# Patient Record
Sex: Female | Born: 1955 | Race: White | Hispanic: No | Marital: Married | Smoking: Never smoker
Health system: Southern US, Community
[De-identification: ages and names within clinical notes are randomized; demographics above are authoritative.]

## PROBLEM LIST (undated history)

## (undated) DIAGNOSIS — E079 Disorder of thyroid, unspecified: Secondary | ICD-10-CM

## (undated) HISTORY — PX: OTHER SURGICAL HISTORY: SHX169

---

## 1984-07-18 HISTORY — PX: HYSTEROSCOPY WITH D & C: SHX1775

## 1999-12-02 ENCOUNTER — Emergency Department (HOSPITAL_COMMUNITY): Admission: EM | Admit: 1999-12-02 | Discharge: 1999-12-02 | Payer: Self-pay | Admitting: Emergency Medicine

## 2000-12-19 ENCOUNTER — Encounter: Admission: RE | Admit: 2000-12-19 | Discharge: 2000-12-19 | Payer: Self-pay | Admitting: Obstetrics and Gynecology

## 2000-12-19 ENCOUNTER — Encounter: Payer: Self-pay | Admitting: Obstetrics and Gynecology

## 2001-12-27 ENCOUNTER — Encounter: Admission: RE | Admit: 2001-12-27 | Discharge: 2001-12-27 | Payer: Self-pay | Admitting: Obstetrics and Gynecology

## 2001-12-27 ENCOUNTER — Encounter: Payer: Self-pay | Admitting: Obstetrics and Gynecology

## 2004-01-05 ENCOUNTER — Encounter: Admission: RE | Admit: 2004-01-05 | Discharge: 2004-01-05 | Payer: Self-pay | Admitting: Obstetrics and Gynecology

## 2011-04-09 ENCOUNTER — Encounter: Payer: Self-pay | Admitting: Emergency Medicine

## 2011-04-09 ENCOUNTER — Inpatient Hospital Stay (INDEPENDENT_AMBULATORY_CARE_PROVIDER_SITE_OTHER)
Admission: RE | Admit: 2011-04-09 | Discharge: 2011-04-09 | Disposition: A | Discharge status: Home / Self Care | Payer: BC Managed Care – PPO | Source: Ambulatory Visit | Attending: Emergency Medicine | Admitting: Emergency Medicine

## 2011-04-09 DIAGNOSIS — N390 Urinary tract infection, site not specified: Secondary | ICD-10-CM

## 2011-04-09 DIAGNOSIS — E039 Hypothyroidism, unspecified: Secondary | ICD-10-CM | POA: Insufficient documentation

## 2011-04-09 LAB — CONVERTED CEMR LAB
Bilirubin Urine: NEGATIVE
Glucose, Urine, Semiquant: NEGATIVE
Ketones, urine, test strip: NEGATIVE
Nitrite: NEGATIVE
Protein, U semiquant: NEGATIVE
Specific Gravity, Urine: <1.005
Urobilinogen, UA: 0.2
pH: 6.5

## 2011-04-11 ENCOUNTER — Telehealth (INDEPENDENT_AMBULATORY_CARE_PROVIDER_SITE_OTHER): Payer: Self-pay | Admitting: *Deleted

## 2011-06-20 NOTE — Progress Notes (Signed)
Summary: uti/TM(rm5)   Vital Signs:  Patient Profile:   55 Years Old Female CC:      ? UTI Height:     66 inches Weight:      167.25 pounds O2 Sat:      98 % O2 treatment:    Room Air Temp:     98.1 degrees F oral Resp:     18 per minute BP sitting:   135 / 85  (left arm) Cuff size:   regular  Vitals Entered By: Linton Flemings RN (April 09, 2011 10:55 AM)                  Updated Prior Medication List: LEVOTHROID 75 MCG TABS (LEVOTHYROXINE SODIUM) daily  Current Allergies: ! PENICILLINHistory of Present Illness History from: patient Chief Complaint: ? UTI History of Present Illness: 55 Years Old Female complains of UTI symptoms for 2 days.  She describes the pain as burning during urination.  She has not used any OTC meds. + dysuria + frequency No urgency No hematuria No vaginal discharge No fever/chills +/- lower abdomenal pain No back pain No fatigue   REVIEW OF SYSTEMS Constitutional Symptoms      Denies fever, chills, night sweats, weight loss, weight gain, and fatigue.  Eyes       Denies change in vision, eye pain, eye discharge, glasses, contact lenses, and eye surgery. Ear/Nose/Throat/Mouth       Denies hearing loss/aids, change in hearing, ear pain, ear discharge, dizziness, frequent runny nose, frequent nose bleeds, sinus problems, sore throat, hoarseness, and tooth pain or bleeding.  Respiratory       Denies dry cough, productive cough, wheezing, shortness of breath, asthma, bronchitis, and emphysema/COPD.  Cardiovascular       Denies murmurs, chest pain, and tires easily with exhertion.    Gastrointestinal       Denies stomach pain, nausea/vomiting, diarrhea, constipation, blood in bowel movements, and indigestion. Genitourniary       Complains of painful urination.      Denies kidney stones and loss of urinary control.      Comments: frequency Neurological       Denies paralysis, seizures, and fainting/blackouts. Musculoskeletal  Denies muscle pain, joint pain, joint stiffness, decreased range of motion, redness, swelling, muscle weakness, and gout.  Skin       Denies bruising, unusual mles/lumps or sores, and hair/skin or nail changes.  Psych       Denies mood changes, temper/anger issues, anxiety/stress, speech problems, depression, and sleep problems. Other Comments: started thursday   Past History:  Past Medical History: Hypothyroidism  Past Surgical History: Carpal tunnel right hand- 12/2009  Family History: Reviewed history and no changes required.  Social History: smoke-no alcohol-no rec. drugs-no Physical Exam General appearance: well developed, well nourished, no acute distress Abdomen: soft, non-tender without obvious organomegaly Back: no CVA or flank tenderness MSE: oriented to time, place, and person Assessment New Problems: HYPOTHYROIDISM (ICD-244.9) URINARY TRACT INFECTION (ICD-599.0)   Patient Education: Patient and/or caregiver instructed in the following: rest, fluids.  Plan New Medications/Changes: CIPROFLOXACIN HCL 500 MG TABS (CIPROFLOXACIN HCL) 1 by mouth two times a day for 5 days  #10 x 0, 04/09/2011, Hoyt Koch MD  New Orders: New Patient Level III [id number] UA Dipstick w/o Micro (automated)  [81003] T-Culture, Urine [[id number]] Planning Comments:   Urine culture pending Follow-up with your primary care physician if not improving or if getting worse   The patient and/or caregiver has been  counseled thoroughly with regard to medications prescribed including dosage, schedule, interactions, rationale for use, and possible side effects and they verbalize understanding.  Diagnoses and expected course of recovery discussed and will return if not improved as expected or if the condition worsens. Patient and/or caregiver verbalized understanding.  Prescriptions: CIPROFLOXACIN HCL 500 MG TABS (CIPROFLOXACIN HCL) 1 by mouth two times a day for 5 days  #10 x 0   Entered  and Authorized by:   Hoyt Koch MD   Signed by:   Hoyt Koch MD on 04/09/2011   Method used:   Print then Give to Patient   RxID:   [id number]   Orders Added: 1)  New Patient Level III [32440] 2)  UA Dipstick w/o Micro (automated)  [81003] 3)  T-Culture, Urine [[id number]]    Laboratory Results   Urine Tests  Date/Time Received: April 09, 2011 11:14 AM  Date/Time Reported: April 09, 2011 11:14 AM   Routine Urinalysis   Color: yellow Appearance: Clear Glucose: negative   (Normal Range: Negative) Bilirubin: negative   (Normal Range: Negative) Ketone: negative   (Normal Range: Negative) Spec. Gravity: <1.005   (Normal Range: 1.003-1.035) Blood: trace-lysed   (Normal Range: Negative) pH: 6.5   (Normal Range: 5.0-8.0) Protein: negative   (Normal Range: Negative) Urobilinogen: 0.2   (Normal Range: 0-1) Nitrite: negative   (Normal Range: Negative) Leukocyte Esterace: 2+   (Normal Range: Negative)

## 2011-06-20 NOTE — Telephone Encounter (Signed)
  Phone Note Outgoing Call Call back at [facility] Phone [phone]   Call placed by: Lajean Saver RN,  April 11, 2011 3:59 PM Call placed to: Patient Summary of Call: Callback: No answer. Unable to leave message.

## 2011-07-29 ENCOUNTER — Emergency Department
Admission: EM | Admit: 2011-07-29 | Discharge: 2011-07-29 | Disposition: A | Discharge status: Home / Self Care | Payer: BC Managed Care – PPO | Source: Home / Self Care | Attending: Emergency Medicine | Admitting: Emergency Medicine

## 2011-07-29 ENCOUNTER — Encounter: Payer: Self-pay | Admitting: Emergency Medicine

## 2011-07-29 DIAGNOSIS — L03114 Cellulitis of left upper limb: Secondary | ICD-10-CM

## 2011-07-29 DIAGNOSIS — L02519 Cutaneous abscess of unspecified hand: Secondary | ICD-10-CM

## 2011-07-29 HISTORY — DX: Disorder of thyroid, unspecified: E07.9

## 2011-07-29 MED ORDER — DOXYCYCLINE HYCLATE 100 MG PO CAPS: 100.0000 mg | ORAL_CAPSULE | Freq: Two times a day (BID) | ORAL | Status: AC

## 2011-07-29 NOTE — ED Provider Notes (Addendum)
History     CSN: [id number]  Arrival date & time 07/29/11  1346   None     Chief Complaint  Patient presents with  . Insect Bite    (Consider location/radiation/quality/duration/timing/severity/associated sxs/prior treatment) HPI She is a right-handed female who presents with a left hand possible spider bite. It happened about 6 days when she was petting her dog that has long hair. She does not know if she was bit by something but states that it could have been a spider. Since then she's had very mild swelling and bruising of her left hand. She states that it does not seem to be getting any worse, however her family did suggest that she come to the doctor to have it checked out. No fever, chills, nausea, vomiting.  Past Medical History  Diagnosis Date  . Thyroid disease     Past Surgical History  Procedure Date  . Carpel tunnel     No family history on file.  History  Substance Use Topics  . Smoking status: Never Smoker   . Smokeless tobacco: Not on file  . Alcohol Use: No    OB History    Grav Para Term Preterm Abortions TAB SAB Ect Mult Living                  Review of Systems  Allergies  Penicillins  Home Medications   Current Outpatient Rx  Name Route Sig Dispense Refill  . LEVOTHYROXINE SODIUM 75 MCG PO TABS Oral Take 75 mcg by mouth daily.    Marland Kitchen DOXYCYCLINE HYCLATE 100 MG PO CAPS Oral Take 1 capsule (100 mg total) by mouth 2 (two) times daily. 18 capsule 0    BP 130/81  Pulse 63  Temp(Src) 98.4 F (36.9 C) (Oral)  Resp 18  Ht 5\' 6"  (1.676 m)  Wt 165 lb (74.844 kg)  BMI 26.63 kg/m2  SpO2 99%  Physical Exam  Nursing note and vitals reviewed. Constitutional: She is oriented to person, place, and time. She appears well-developed and well-nourished.  HENT:  Head: Normocephalic and atraumatic.  Eyes: No scleral icterus.  Neck: Neck supple.  Cardiovascular: Regular rhythm and normal heart sounds.   Pulmonary/Chest: Effort normal and breath  sounds normal. No respiratory distress.  Neurological: She is alert and oriented to person, place, and time.  Skin: Skin is warm and dry.       On her left hand around the second finger MCP and the dorsal webspace there is a area approximately 3 cm in diameter that has erythema and is mildly swollen and mildly tender. There is an area that appears to be a very small bite mark from an insect. She has full range of motion the distal neurovascular status is intact.  Psychiatric: She has a normal mood and affect. Her speech is normal.    ED Course  Procedures (including critical care time)  Labs Reviewed - No data to display No results found.   1. Cellulitis of left hand       MDM   The lesion on her left hand may be a very slight staph infection in am going to give her prescription for doxycycline. However it also may be some bruising from a bite or just some inflammation. Due to the location I do not believe that a tic would be on her long enough to transmit anything worse and the patient agrees. She should continue to elevate, ice. If things are getting worse, I have advised the patient  that she needs to make sure that she seeks medical care.  Lily Kocher, MD 07/29/11 1429  Lily Kocher, MD 07/29/11 [phone]

## 2011-07-29 NOTE — ED Notes (Signed)
6 days ago was petting her dog and felt insect bite her; did not see tick or any other critter; was itchy x 24 hours; now slightly edematous and ecchymotic.

## 2011-08-16 ENCOUNTER — Emergency Department
Admission: EM | Admit: 2011-08-16 | Discharge: 2011-08-16 | Disposition: A | Discharge status: Home / Self Care | Payer: BC Managed Care – PPO | Source: Home / Self Care | Attending: Family Medicine | Admitting: Family Medicine

## 2011-08-16 ENCOUNTER — Encounter: Payer: Self-pay | Admitting: *Deleted

## 2011-08-16 DIAGNOSIS — N760 Acute vaginitis: Secondary | ICD-10-CM

## 2011-08-16 DIAGNOSIS — R300 Dysuria: Secondary | ICD-10-CM

## 2011-08-16 DIAGNOSIS — J029 Acute pharyngitis, unspecified: Secondary | ICD-10-CM

## 2011-08-16 LAB — POCT RAPID STREP A (OFFICE): Rapid Strep A Screen: NEGATIVE

## 2011-08-16 LAB — POCT URINALYSIS DIP (MANUAL ENTRY)
Bilirubin, UA: NEGATIVE
Blood, UA: NEGATIVE
Glucose, UA: NEGATIVE
Ketones, POC UA: NEGATIVE
Leukocytes, UA: NEGATIVE
Nitrite, UA: NEGATIVE
Protein Ur, POC: NEGATIVE
Spec Grav, UA: 1.01 (ref 1.005–1.03)
Urobilinogen, UA: 0.2 (ref 0–1)
pH, UA: 7 (ref 5–8)

## 2011-08-16 MED ORDER — FLUCONAZOLE 150 MG PO TABS: ORAL_TABLET | ORAL | Status: AC

## 2011-08-16 NOTE — ED Provider Notes (Signed)
History     CSN: [id number]  Arrival date & time 08/16/11  1107   First MD Initiated Contact with Patient 08/16/11 1223      Chief Complaint  Patient presents with  . Sore Throat  . Dysuria     HPI Comments: Patient states that she had dental work yesterday, and subsequently noticed a sore throat, mostly on left.  She will be taking care of young children next weekend, and wants to ensure she does not have a strep infection.  No other URI symptoms and she feels well otherwise. She also notes that she has had a slightly pruritic vaginal discharge past several days without pelvic or abdominal pain, which she states feels like a yeast infection.  She finished up a course of doxycycline one week ago.  She has noticed mild urinary frequency but no dysuria.  Patient is a 56 y.o. female presenting with pharyngitis and dysuria. The history is provided by the patient.  Sore Throat This is a new problem. The current episode started yesterday. The problem occurs constantly. The problem has not changed since onset.The symptoms are aggravated by swallowing. The symptoms are relieved by nothing.  Dysuria     Past Medical History  Diagnosis Date  . Thyroid disease     Past Surgical History  Procedure Date  . Carpel tunnel     History reviewed. No pertinent family history.  History  Substance Use Topics  . Smoking status: Never Smoker   . Smokeless tobacco: Not on file  . Alcohol Use: No    OB History    Grav Para Term Preterm Abortions TAB SAB Ect Mult Living                  Review of Systems  Genitourinary: Positive for dysuria.   + sore throat No cough No pleuritic pain No wheezing + mild nasal congestion ? post-nasal drainage No sinus pain/pressure No itchy/red eyes No earache No hemoptysis No SOB No fever/chills No nausea No vomiting No abdominal pain No diarrhea No urinary symptoms + mild vaginal discharge No skin rashes No fatigue No myalgias No  headache Used OTC meds without relief  Allergies  Penicillins  Home Medications   Current Outpatient Rx  Name Route Sig Dispense Refill  . FLUCONAZOLE 150 MG PO TABS  Take one by mouth as a single dose 1 tablet 0  . LEVOTHYROXINE SODIUM 75 MCG PO TABS Oral Take 75 mcg by mouth daily.      BP 132/82  Pulse 80  Temp(Src) 98.4 F (36.9 C) (Oral)  Resp 14  Ht 5\' 6"  (1.676 m)  Wt 168 lb (76.204 kg)  BMI 27.12 kg/m2  SpO2 100%  Physical Exam Nursing notes and Vital Signs reviewed. Appearance:  Patient appears healthy, stated age, and in no acute distress Eyes:  Pupils are equal, round, and reactive to light and accomodation.  Extraocular movement is intact.  Conjunctivae are not inflamed  Ears:   Right canal normal; left canal partly occluded with cerumen.  Right tympanic membrane normal.  Nose:  Mildly congested turbinates.  No sinus tenderness.   Pharynx:  Normal Neck:  Supple. No adenopathy  Lungs:  Clear to auscultation.  Breath sounds are equal.  Heart:  Regular rate and rhythm without murmurs, rubs, or gallops.  Abdomen:  Nontender without masses or hepatosplenomegaly.  Bowel sounds are present.  No CVA or flank tenderness.     ED Course  Procedures none   Labs Reviewed  POCT URINALYSIS DIP (MANUAL ENTRY) negative  POCT RAPID STREP A (OFFICE) negative         1. Acute pharyngitis   2. Vaginitis       MDM  With recent history of antibiotic treatment, vaginal discharge is suggestive of candida.  Will empirically treat with Diflucan. There is no evidence of bacterial infection today.  Treat symptomatically for now: If cold symptoms develop, recommend: Take Mucinex D (guaifenesin with decongestant) twice daily for congestion.  Increase fluid intake, rest. May use Afrin nasal spray (or generic oxymetazoline) twice daily for about 5 days.  Also recommend using saline nasal spray several times daily and saline nasal irrigation (AYR is a common brand) Stop all  antihistamines for now, and other non-prescription cough/cold preparations. May take Delsym Cough Suppressant at bedtime for nighttime cough.  Follow-up with family doctor if not improving 7 to 10 days.         Donna Christen, MD 08/16/11 2147

## 2011-08-16 NOTE — ED Notes (Signed)
Patient c/o vaginal itching x 2 days. She finished an antibiotic 1 week ago. She has not seen any yeast. She also c/o of a sore throat x last night. She had dental work done for 2 1/2 hours yesterday, leaving her mouth open the whole time.

## 2021-07-23 ENCOUNTER — Encounter: Payer: Self-pay | Admitting: Emergency Medicine

## 2021-07-23 ENCOUNTER — Emergency Department
Admission: EM | Admit: 2021-07-23 | Discharge: 2021-07-23 | Disposition: A | Discharge status: Home / Self Care | Payer: Medicare PPO | Source: Home / Self Care | Attending: Family Medicine | Admitting: Family Medicine

## 2021-07-23 ENCOUNTER — Emergency Department (INDEPENDENT_AMBULATORY_CARE_PROVIDER_SITE_OTHER): Payer: Medicare PPO

## 2021-07-23 DIAGNOSIS — M79661 Pain in right lower leg: Secondary | ICD-10-CM | POA: Diagnosis not present

## 2021-07-23 DIAGNOSIS — M79604 Pain in right leg: Secondary | ICD-10-CM | POA: Diagnosis not present

## 2021-07-23 NOTE — ED Triage Notes (Signed)
Pain below R knee after hitting her leg on her husband's w/c 4 weeks ago  Pt had a bruise which resloved - continues to have pain  Tylenol  OTC - none recently Pain only when pressure is applied  No pain with walking

## 2021-07-23 NOTE — ED Provider Notes (Signed)
[facility]    CSN: [id number] Arrival date & time: 07/23/21  0807      History   Chief Complaint Chief Complaint  Patient presents with   Leg Pain    right    HPI Lori Murillo is a 66 y.o. female.   Patient complains of pain in her right anterior knee below the patella for about 4 weeks.  She reports that her husband had a stroke and she must help him transfer to and from his wheelchair.  She admits that she is constantly bumping her right anterior knee on the frame of his wheelchair.  The history is provided by the patient.  Leg Pain Location:  Knee Time since incident:  4 weeks Knee location:  R knee Pain details:    Quality:  Aching   Radiates to:  Does not radiate   Severity:  Mild   Onset quality:  Gradual   Duration:  4 weeks   Timing:  Constant   Progression:  Unchanged Relieved by:  Nothing Worsened by:  Extension Ineffective treatments:  Acetaminophen Associated symptoms: swelling   Associated symptoms: no decreased ROM, no fever, no muscle weakness, no numbness and no stiffness    Past Medical History:  Diagnosis Date   Thyroid disease     Patient Active Problem List   Diagnosis Date Noted   HYPOTHYROIDISM 04/09/2011    Past Surgical History:  Procedure Laterality Date   carpel tunnel     HYSTEROSCOPY WITH D & C  1986    OB History   No obstetric history on file.      Home Medications    Prior to Admission medications   Medication Sig Start Date End Date Taking? Authorizing Provider  methenamine (HIPREX) 1 g tablet Take 1 g by mouth 2 (two) times daily with a meal.   Yes [provider]  vitamin C (ASCORBIC ACID) 500 MG tablet Take 500 mg by mouth 2 (two) times daily.   Yes [provider]  fluconazole (DIFLUCAN) 150 MG tablet Take one by mouth as a single dose Patient not taking: Reported on 07/23/2021 08/16/11   Kandra Nicolas, MD  levothyroxine (SYNTHROID, LEVOTHROID) 75 MCG tablet Take 75 mcg by  mouth daily.    [provider]    Family History Family History  Problem Relation Age of Onset   Heart failure Mother    Colon cancer Father     Social History Social History   Tobacco Use   Smoking status: Never  Vaping Use   Vaping Use: Never used  Substance Use Topics   Alcohol use: No   Drug use: No     Allergies   Penicillins   Review of Systems Review of Systems  Constitutional:  Negative for chills, diaphoresis and fever.  Respiratory:  Negative for cough, chest tightness, shortness of breath, wheezing and stridor.   Cardiovascular:  Positive for leg swelling. Negative for chest pain.  Musculoskeletal:  Negative for joint swelling and stiffness.  Skin:  Negative for color change and wound.  All other systems reviewed and are negative.   Physical Exam Triage Vital Signs ED Triage Vitals  Enc Vitals Group     BP 07/23/21 0833 121/78     Pulse Rate 07/23/21 0833 86     Resp 07/23/21 0833 16     Temp 07/23/21 0833 98.5 F (36.9 C)     Temp Source 07/23/21 0833 Oral     SpO2 07/23/21 0833  100 %     Weight 07/23/21 0837 148 lb (67.1 kg)     Height 07/23/21 0837 5\' 6"  (1.676 m)     Head Circumference --      Peak Flow --      Pain Score 07/23/21 0835 2     Pain Loc --      Pain Edu? --      Excl. in [place]? --    No data found.  Updated Vital Signs BP 121/78 (BP Location: Left Arm)    Pulse 86    Temp 98.5 F (36.9 C) (Oral)    Resp 16    Ht 5\' 6"  (1.676 m)    Wt 67.1 kg    SpO2 100%    BMI 23.89 kg/m   Visual Acuity Right Eye Distance:   Left Eye Distance:   Bilateral Distance:    Right Eye Near:   Left Eye Near:    Bilateral Near:     Physical Exam Vitals and nursing note reviewed.  Constitutional:      General: She is not in acute distress. HENT:     Head: Normocephalic.  Eyes:     Conjunctiva/sclera: Conjunctivae normal.     Pupils: Pupils are equal, round, and reactive to light.  Cardiovascular:     Rate and Rhythm: Normal  rate.  Pulmonary:     Effort: Pulmonary effort is normal.  Musculoskeletal:     Right lower leg: No edema.     Left lower leg: No edema.       Legs:     Comments: Patient's right anterior lower leg has tenderness to palpation just beneath right tibial tubercle.  There is no swelling or ecchymosis.  The tenderness extends slightly inferior to the anterior compartment.  No posterior calf tenderness.  Right knee has full range of motion without tenderness to palpation.  Skin:    General: Skin is warm and dry.     Findings: No erythema.  Neurological:     Mental Status: She is alert and oriented to person, place, and time.     UC Treatments / Results  Labs (all labs ordered are listed, but only abnormal results are displayed) Labs Reviewed - No data to display  EKG   Radiology DG Tibia/Fibula Right  Result Date: 07/23/2021 CLINICAL DATA:  Proximal anterior right tibial pain. EXAM: RIGHT TIBIA AND FIBULA - 2 VIEW COMPARISON:  None. FINDINGS: Normal bone mineralization. The knee and ankle joint spaces are preserved. No acute fracture is seen. No knee joint effusion. No dislocation. IMPRESSION: No acute fracture. Electronically Signed   By: Yvonne Kendall   On: 07/23/2021 10:02    Procedures Procedures (including critical care time)  Medications Ordered in UC Medications - No data to display  Initial Impression / Assessment and Plan / UC Course  I have reviewed the triage vital signs and the nursing notes.  Pertinent labs & imaging results that were available during my care of the patient were reviewed by me and considered in my medical decision making (see chart for details).    Will treat as a shin splint.  Ace wrap applied. Given treatment instructions with range of motion and stretching exercises.  Recommend wearing a protective shield when moving husband to protect her right right knee and lateral leg.  Final Clinical Impressions(s) / UC Diagnoses   Final diagnoses:   Right leg pain     Discharge Instructions      Recommend applying light  Ace wrap to decrease swelling.  Apply ice pack for 20 to 30 minutes, 3 to 4 times daily  Continue until pain and swelling decrease.  May take Ibuprofen 200mg , 4 tabs every 8 hours with food as needed.  Begin range of motion and stretching exercises as tolerated.     ED Prescriptions   None       Kandra Nicolas, MD 07/24/21 1709

## 2021-07-23 NOTE — Discharge Instructions (Signed)
Recommend applying light Ace wrap to decrease swelling.  Apply ice pack for 20 to 30 minutes, 3 to 4 times daily  Continue until pain and swelling decrease.  May take Ibuprofen 200mg , 4 tabs every 8 hours with food as needed.  Begin range of motion and stretching exercises as tolerated.

## 2022-03-20 IMAGING — DX DG TIBIA/FIBULA 2V*R*
3 series · 3 of 3 positions shown · non-contrast
Comparison: None.

CLINICAL DATA: Proximal anterior right tibial pain.

EXAM:
RIGHT TIBIA AND FIBULA - 2 VIEW

[tibia ap (1 of 2)]
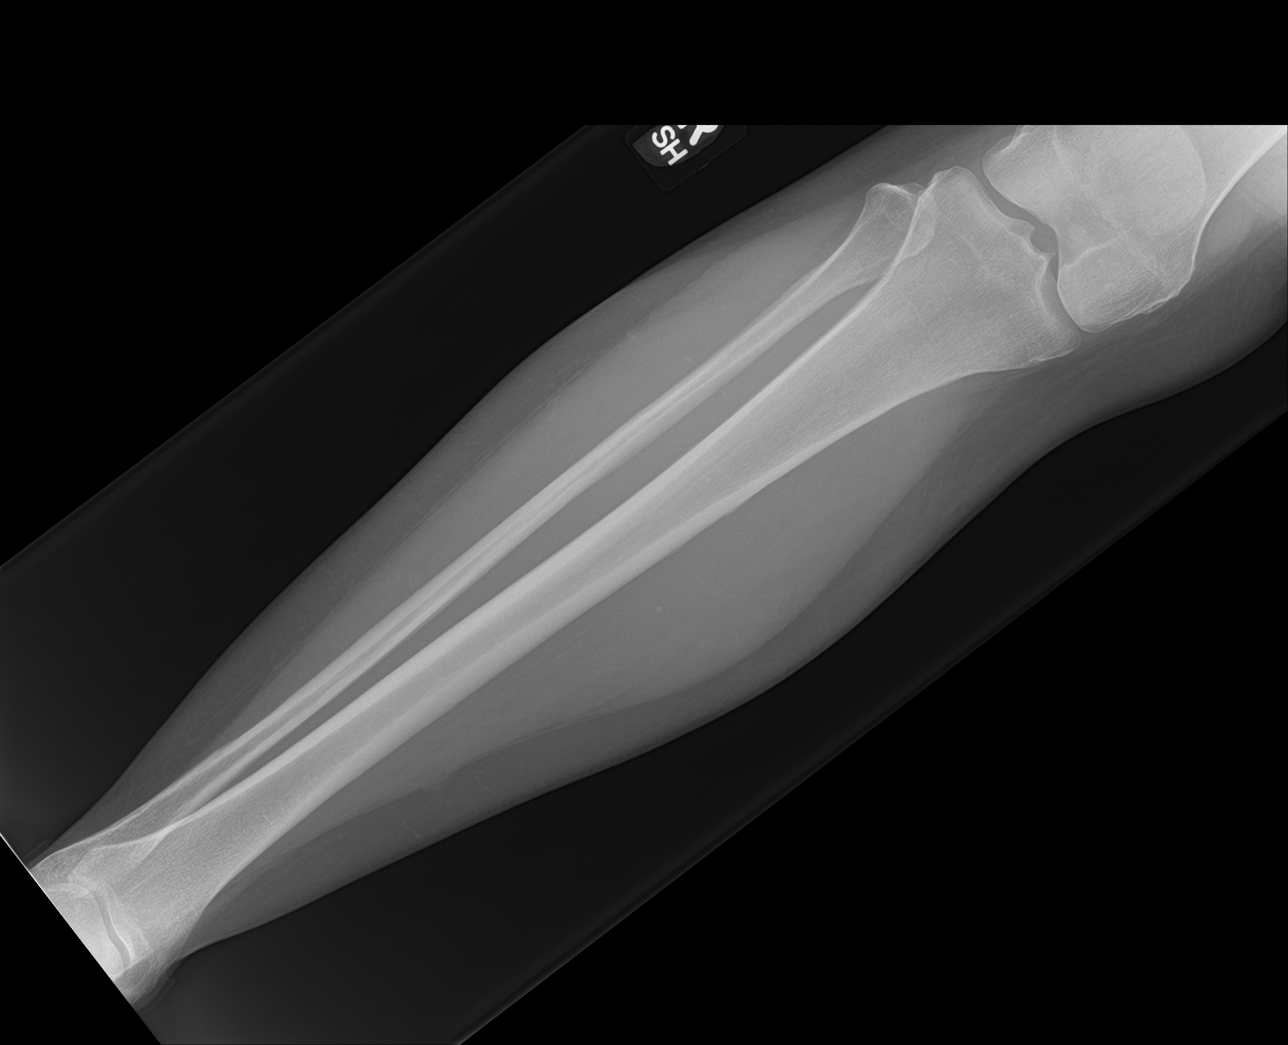

[tibia ap (2 of 2)]
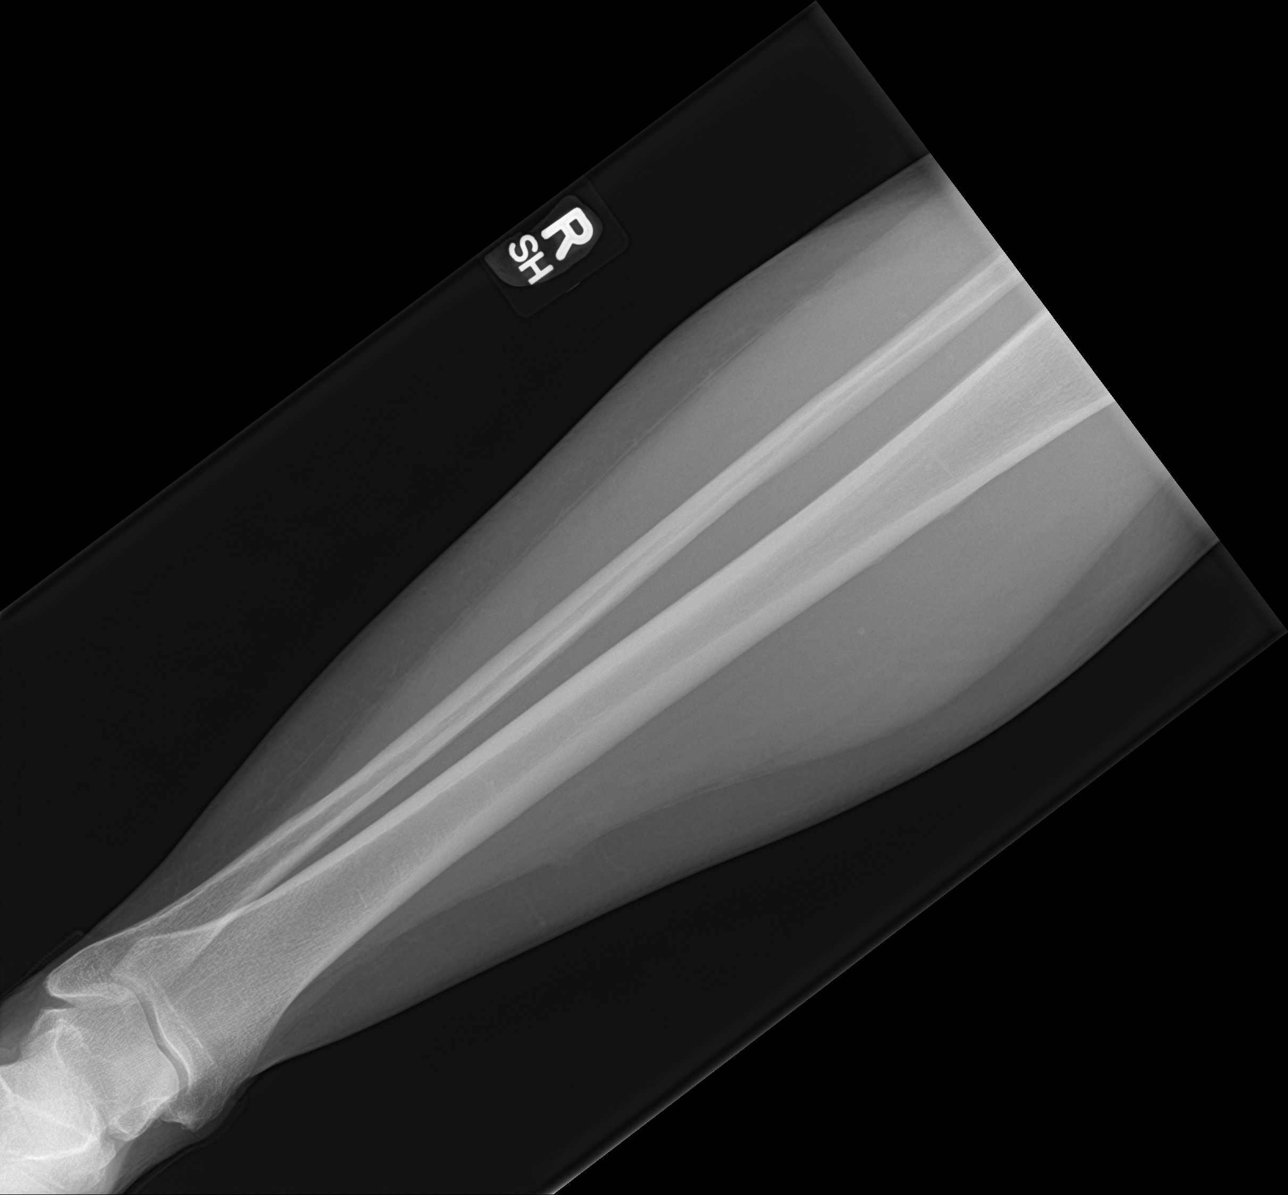

[tibia lat]
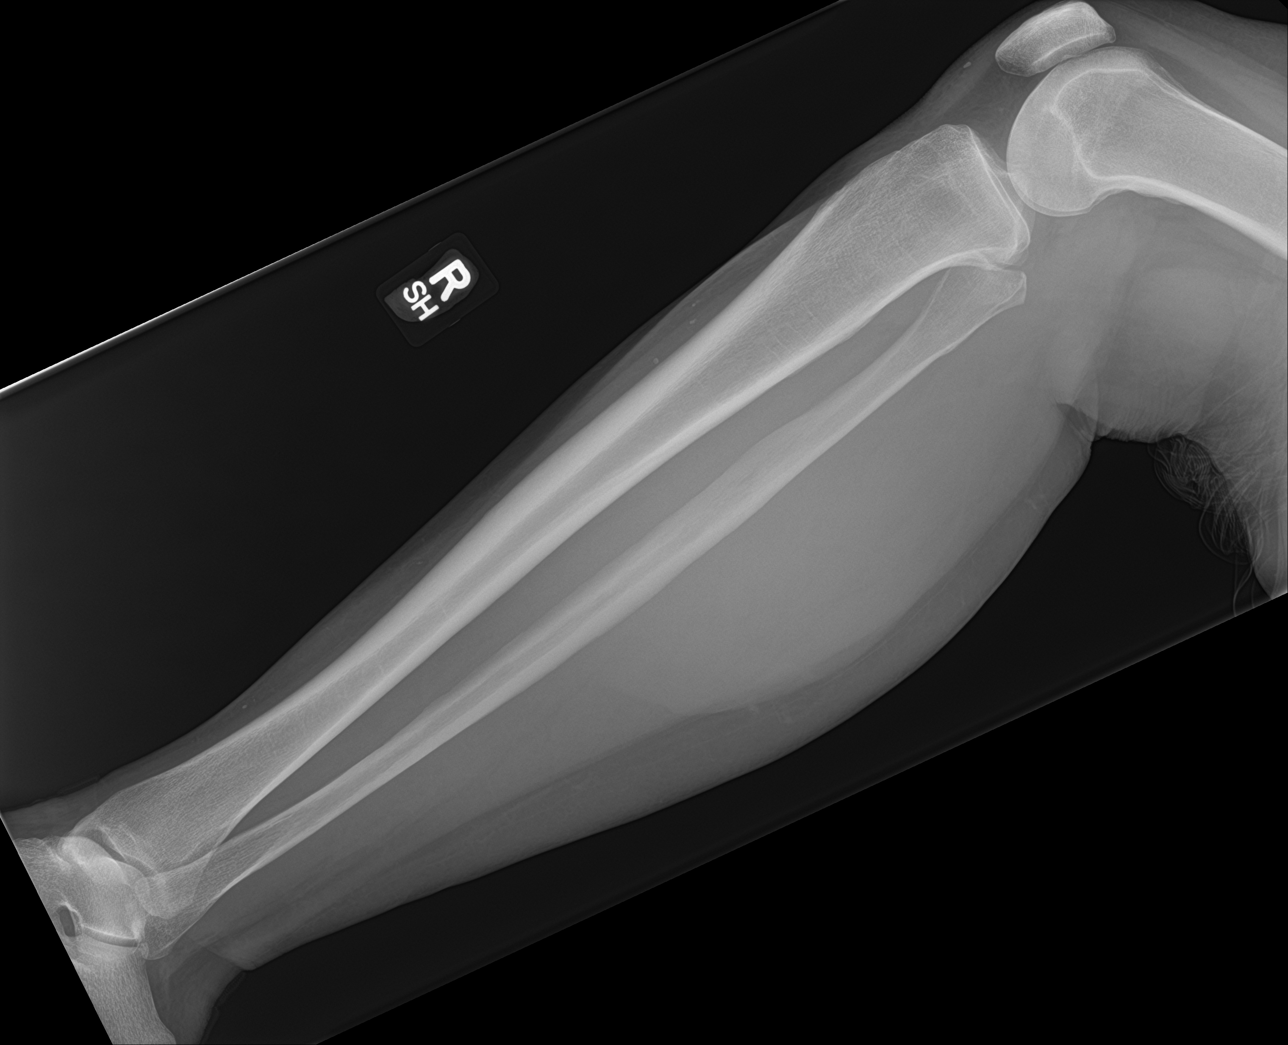

[3 of 3 positions shown; findings below may reference images not displayed]

FINDINGS: Normal bone mineralization. The knee and ankle joint spaces are
preserved. No acute fracture is seen. No knee joint effusion. No
dislocation.
IMPRESSION: No acute fracture.

## 2023-11-04 ENCOUNTER — Ambulatory Visit
Admission: EM | Admit: 2023-11-04 | Discharge: 2023-11-04 | Disposition: A | Discharge status: Home / Self Care | Attending: Family Medicine | Admitting: Family Medicine

## 2023-11-04 ENCOUNTER — Other Ambulatory Visit: Payer: Self-pay

## 2023-11-04 DIAGNOSIS — H6120 Impacted cerumen, unspecified ear: Secondary | ICD-10-CM | POA: Diagnosis not present

## 2023-11-04 DIAGNOSIS — R300 Dysuria: Secondary | ICD-10-CM

## 2023-11-04 DIAGNOSIS — H6122 Impacted cerumen, left ear: Secondary | ICD-10-CM | POA: Diagnosis not present

## 2023-11-04 LAB — POCT URINALYSIS DIP (MANUAL ENTRY)
Bilirubin, UA: NEGATIVE
Blood, UA: NEGATIVE
Glucose, UA: NEGATIVE mg/dL
Ketones, POC UA: NEGATIVE mg/dL
Nitrite, UA: POSITIVE — AB
Protein Ur, POC: NEGATIVE mg/dL
Spec Grav, UA: 1.015 (ref 1.010–1.025)
Urobilinogen, UA: 0.2 U/dL
pH, UA: 5.5 (ref 5.0–8.0)

## 2023-11-04 MED ORDER — SULFAMETHOXAZOLE-TRIMETHOPRIM 800-160 MG PO TABS: 1.0000 | ORAL_TABLET | Freq: Two times a day (BID) | ORAL | 0 refills | Status: AC

## 2023-11-04 NOTE — ED Provider Notes (Signed)
 [facility]    CSN: [id number] Arrival date & time: 11/04/23  1413      History   Chief Complaint Chief Complaint  Patient presents with   Dysuria   Ear Fullness    HPI Lori Murillo is a 68 y.o. female.   Patient is here with 2 problems.  First, she states that her right ear has been clogged up for weeks.  Ever since she last had her haircut.  She states that water got in her ear and feels like it never left.  Her hearing is diminished right greater than left.  She has not had problems with earwax buildup in the past.  No cold or sinus symptoms. Patient also think she has a bladder infection.  She had 1 in January.  It feels similar with dysuria and urinary frequency.  No abdominal pain.  No fever or chills.  No nausea or vomiting.  She was treated last time with nitrofurantoin .    Past Medical History:  Diagnosis Date   Thyroid disease     Patient Active Problem List   Diagnosis Date Noted   Hypothyroidism 04/09/2011    Past Surgical History:  Procedure Laterality Date   carpel tunnel     HYSTEROSCOPY WITH D & C  1986    OB History   No obstetric history on file.      Home Medications    Prior to Admission medications   Medication Sig Start Date End Date Taking? Authorizing Provider  apixaban (ELIQUIS) 5 MG TABS tablet Take 5 mg by mouth. 06/13/23  Yes [provider]  metoprolol succinate (TOPROL-XL) 25 MG 24 hr tablet Take 25 mg by mouth. 06/13/23  Yes [provider]  rosuvastatin (CRESTOR) 5 MG tablet Take 5 mg by mouth. 10/10/23  Yes [provider]  sulfamethoxazole -trimethoprim  (BACTRIM  DS) 800-160 MG tablet Take 1 tablet by mouth 2 (two) times daily for 7 days. 11/04/23 11/11/23 Yes Stephany Ehrich, MD  fluconazole  (DIFLUCAN ) 150 MG tablet Take one by mouth as a single dose Patient not taking: Reported on 07/23/2021 08/16/11   Leon Rajas, MD  levothyroxine (SYNTHROID, LEVOTHROID) 75 MCG tablet Take 75  mcg by mouth daily.    [provider]  methenamine (HIPREX) 1 g tablet Take 1 g by mouth 2 (two) times daily with a meal.    [provider]  vitamin C (ASCORBIC ACID) 500 MG tablet Take 500 mg by mouth 2 (two) times daily.    [provider]    Family History Family History  Problem Relation Age of Onset   Heart failure Mother    Colon cancer Father     Social History Social History   Tobacco Use   Smoking status: Never  Vaping Use   Vaping status: Never Used  Substance Use Topics   Alcohol use: No   Drug use: No     Allergies   Penicillins   Review of Systems Review of Systems See HPI  Physical Exam Triage Vital Signs ED Triage Vitals  Encounter Vitals Group     BP 11/04/23 1435 122/70     Systolic BP Percentile --      Diastolic BP Percentile --      Pulse Rate 11/04/23 1435 69     Resp 11/04/23 1435 15     Temp 11/04/23 1435 98.4 F (36.9 C)     Temp src --      SpO2 11/04/23 1435 98 %  Weight --      Height --      Head Circumference --      Peak Flow --      Pain Score 11/04/23 1434 0     Pain Loc --      Pain Education --      Exclude from Growth Chart --    No data found.  Updated Vital Signs BP 122/70   Pulse 69   Temp 98.4 F (36.9 C)   Resp 15   SpO2 98%      Physical Exam Constitutional:      General: She is not in acute distress.    Appearance: Normal appearance. She is well-developed.  HENT:     Head: Normocephalic and atraumatic.     Ears:     Comments: Both TMs are blocked by cerumen Eyes:     Conjunctiva/sclera: Conjunctivae normal.     Pupils: Pupils are equal, round, and reactive to light.  Cardiovascular:     Rate and Rhythm: Normal rate.  Pulmonary:     Effort: Pulmonary effort is normal. No respiratory distress.  Abdominal:     General: There is no distension.     Palpations: Abdomen is soft.     Tenderness: There is no right CVA tenderness or left CVA tenderness.   Musculoskeletal:        General: Normal range of motion.     Cervical back: Normal range of motion.  Skin:    General: Skin is warm and dry.  Neurological:     Mental Status: She is alert.      UC Treatments / Results  Labs (all labs ordered are listed, but only abnormal results are displayed) Labs Reviewed  POCT URINALYSIS DIP (MANUAL ENTRY) - Abnormal; Notable for the following components:      Result Value   Nitrite, UA Positive (*)    Leukocytes, UA Moderate (2+) (*)    All other components within normal limits  URINE CULTURE    EKG   Radiology No results found.  Procedures  At the beginning of ear lavage, patient became dizzy and the procedure had to be paused.  Docusate was placed in each ear to try to soften the wax and we waited 10 minutes to try it again. After second attempted irrigation of ears the left ear was easily cleared.  The right ear continued to have a plugged up very hard wax.  It was not able to be dislodged even with a curette and with alligator forceps.   Initial Impression / Assessment and Plan / UC Course  I have reviewed the triage vital signs and the nursing notes.  Pertinent labs & imaging results that were available during my care of the patient were reviewed by me and considered in my medical decision making (see chart for details).     Final Clinical Impressions(s) / UC Diagnoses   Final diagnoses:  Dysuria  Impacted cerumen, unspecified laterality     Discharge Instructions      Drink lots of water Take the septra  2 x a day  Get an ear was softening drop at the pharmacy Use this a week or two and return here or to your doctor  We have sent your urine sample to the laboratory for culture and analysis.  You will be called if any change in antibiotic is needed     ED Prescriptions     Medication Sig Dispense Auth. Provider   sulfamethoxazole -trimethoprim  (BACTRIM  DS)  800-160 MG tablet Take 1 tablet by mouth 2 (two)  times daily for 7 days. 14 tablet Stephany Ehrich, MD      PDMP not reviewed this encounter.   Stephany Ehrich, MD 11/04/23 [phone]

## 2023-11-04 NOTE — ED Triage Notes (Signed)
 Pt presents to uc with co right otalgia/ fullness and decreased hearing for 9 weeks concerned for occlusion of wax. Pt also concerned for uti with new onset of dysuria for 2 days.

## 2023-11-04 NOTE — Discharge Instructions (Signed)
 Drink lots of water Take the septra  2 x a day  Get an ear was softening drop at the pharmacy Use this a week or two and return here or to your doctor  We have sent your urine sample to the laboratory for culture and analysis.  You will be called if any change in antibiotic is needed

## 2023-11-05 ENCOUNTER — Telehealth: Payer: Self-pay

## 2023-11-05 ENCOUNTER — Encounter: Payer: Self-pay | Admitting: Emergency Medicine

## 2023-11-05 MED ORDER — NITROFURANTOIN MONOHYD MACRO 100 MG PO CAPS: 100.0000 mg | ORAL_CAPSULE | Freq: Two times a day (BID) | ORAL | 0 refills | Status: AC

## 2023-11-06 LAB — URINE CULTURE
Culture: >=100000 — AB
Special Requests: NORMAL

## 2024-06-09 NOTE — Telephone Encounter (Signed)
 Entered in error
